# Patient Record
Sex: Male | Born: 1985 | Race: White | Hispanic: No | Marital: Single | Smoking: Former smoker
Health system: Southern US, Community
[De-identification: ages and names within clinical notes are randomized; demographics above are authoritative.]

## PROBLEM LIST (undated history)

## (undated) DIAGNOSIS — F419 Anxiety disorder, unspecified: Secondary | ICD-10-CM

## (undated) HISTORY — DX: Anxiety disorder, unspecified: F41.9

---

## 2012-10-06 ENCOUNTER — Encounter (HOSPITAL_COMMUNITY): Payer: Self-pay | Admitting: Psychiatry

## 2012-10-06 ENCOUNTER — Ambulatory Visit (INDEPENDENT_AMBULATORY_CARE_PROVIDER_SITE_OTHER): Payer: No Typology Code available for payment source | Admitting: Psychiatry

## 2012-10-06 VITALS — BP 117/65 | HR 84 | Ht 70.0 in | Wt 170.8 lb

## 2012-10-06 DIAGNOSIS — F419 Anxiety disorder, unspecified: Secondary | ICD-10-CM | POA: Insufficient documentation

## 2012-10-06 DIAGNOSIS — F411 Generalized anxiety disorder: Secondary | ICD-10-CM

## 2012-10-06 DIAGNOSIS — R51 Headache: Secondary | ICD-10-CM | POA: Insufficient documentation

## 2012-10-06 DIAGNOSIS — R519 Headache, unspecified: Secondary | ICD-10-CM | POA: Insufficient documentation
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
## 2012-11-17 ENCOUNTER — Ambulatory Visit (HOSPITAL_COMMUNITY): Payer: Self-pay | Admitting: Psychiatry

## 2013-03-02 ENCOUNTER — Ambulatory Visit (INDEPENDENT_AMBULATORY_CARE_PROVIDER_SITE_OTHER)
Admission: RE | Admit: 2013-03-02 | Discharge: 2013-03-02 | Disposition: A | Discharge status: Home / Self Care | Payer: No Typology Code available for payment source | Source: Ambulatory Visit | Attending: Family | Admitting: Family

## 2013-03-02 ENCOUNTER — Encounter: Payer: Self-pay | Admitting: Family

## 2013-03-02 ENCOUNTER — Ambulatory Visit (INDEPENDENT_AMBULATORY_CARE_PROVIDER_SITE_OTHER): Payer: No Typology Code available for payment source | Admitting: Family

## 2013-03-02 VITALS — BP 104/78 | Temp 97.8°F | Ht 70.0 in | Wt 161.0 lb

## 2013-03-02 DIAGNOSIS — M79672 Pain in left foot: Secondary | ICD-10-CM

## 2013-03-02 DIAGNOSIS — S8990XA Unspecified injury of unspecified lower leg, initial encounter: Secondary | ICD-10-CM

## 2013-03-02 DIAGNOSIS — S99922A Unspecified injury of left foot, initial encounter: Secondary | ICD-10-CM

## 2013-03-02 DIAGNOSIS — M79609 Pain in unspecified limb: Secondary | ICD-10-CM

## 2013-03-02 NOTE — Progress Notes (Signed)
  Subjective:    Patient ID: Jesse Holmes, male    DOB: [birth year], 27 y.o.   MRN: [id number]  HPI  27 year old male, nonsmoker, is in today to establish care. He has of left foot pain, he periodically had pain to the left foot. After playing tennis one and a half weeks ago, the pain flared again. Therefore, he decided to have it checked. The pain is typically a 5/10, described as achy and worse with certain positions. Has not taken any medication for relief.  Review of Systems  Constitutional: Negative.   HENT: Negative.   Respiratory: Negative.   Cardiovascular: Negative.   Gastrointestinal: Negative.   Endocrine: Negative.   Genitourinary: Negative.   Musculoskeletal: Negative.   Skin: Negative.   Neurological: Negative.   Psychiatric/Behavioral: Negative.    Past Medical History  Diagnosis Date  . Anxiety     History   Social History  . Marital Status: Single    Spouse Name: N/A    Number of Children: N/A  . Years of Education: N/A   Occupational History  . Not on file.   Social History Main Topics  . Smoking status: Former Games developer  . Smokeless tobacco: Not on file  . Alcohol Use: Yes  . Drug Use: No  . Sexual Activity: Not on file   Other Topics Concern  . Not on file   Social History Narrative  . No narrative on file    History reviewed. No pertinent past surgical history.  Family History  Problem Relation Age of Onset  . Depression Mother   . Drug abuse Brother     No Known Allergies  No current outpatient prescriptions on file prior to visit.   No current facility-administered medications on file prior to visit.    BP 104/78  Temp(Src) 97.8 F (36.6 C) (Oral)  Ht 5\' 10"  (1.778 m)  Wt 161 lb (73.029 kg)  BMI 23.1 kg/m2chart    Objective:   Physical Exam  Constitutional: He is oriented to person, place, and time. He appears well-developed and well-nourished.  HENT:  Right Ear: External ear normal.  Left Ear: External ear normal.   Nose: Nose normal.  Mouth/Throat: Oropharynx is clear and moist.  Neck: Normal range of motion. Neck supple.  Cardiovascular: Normal rate, regular rhythm and normal heart sounds.   Pulmonary/Chest: Effort normal and breath sounds normal.  Abdominal: Soft. Bowel sounds are normal.  Musculoskeletal: Normal range of motion. He exhibits tenderness.  Left foot pain to palpation of the dorsal aspect of the foot.  Neurological: He is alert and oriented to person, place, and time.  Skin: Skin is warm and dry.  Psychiatric: He has a normal mood and affect.          Assessment & Plan:  Assessment:  1. left foot pain  Plan: X-ray of the left foot Will notify patient pending results. Aleve as needed for pain. Call the office with any questions or concerns. Recheck as scheduled and as needed.

## 2013-10-26 ENCOUNTER — Other Ambulatory Visit (INDEPENDENT_AMBULATORY_CARE_PROVIDER_SITE_OTHER): Payer: BC Managed Care – PPO

## 2013-10-26 DIAGNOSIS — Z0000 Encounter for general adult medical examination without abnormal findings: Secondary | ICD-10-CM

## 2013-10-26 LAB — CBC WITH DIFFERENTIAL/PLATELET
BASOS ABS: 0 10*3/uL (ref 0.0–0.1)
BASOS PCT: 0.4 % (ref 0.0–3.0)
Eosinophils Absolute: 0.1 10*3/uL (ref 0.0–0.7)
Eosinophils Relative: 1.4 % (ref 0.0–5.0)
HCT: 45.4 % (ref 39.0–52.0)
HEMOGLOBIN: 15.4 g/dL (ref 13.0–17.0)
LYMPHS PCT: 26.4 % (ref 12.0–46.0)
Lymphs Abs: 1.9 10*3/uL (ref 0.7–4.0)
MCHC: 33.9 g/dL (ref 30.0–36.0)
MCV: 89.1 fl (ref 78.0–100.0)
MONOS PCT: 7.2 % (ref 3.0–12.0)
Monocytes Absolute: 0.5 10*3/uL (ref 0.1–1.0)
NEUTROS ABS: 4.6 10*3/uL (ref 1.4–7.7)
NEUTROS PCT: 64.6 % (ref 43.0–77.0)
Platelets: 319 10*3/uL (ref 150.0–400.0)
RBC: 5.09 Mil/uL (ref 4.22–5.81)
RDW: 13.3 % (ref 11.5–14.6)
WBC: 7.1 10*3/uL (ref 4.5–10.5)

## 2013-10-26 LAB — LIPID PANEL
CHOLESTEROL: 130 mg/dL (ref 0–200)
HDL: 58.2 mg/dL (ref >39.00)
LDL Cholesterol: 67 mg/dL (ref 0–99)
TRIGLYCERIDES: 23 mg/dL (ref 0.0–149.0)
Total CHOL/HDL Ratio: 2
VLDL: 4.6 mg/dL (ref 0.0–40.0)

## 2013-10-26 LAB — BASIC METABOLIC PANEL
BUN: 17 mg/dL (ref 6–23)
CALCIUM: 9.1 mg/dL (ref 8.4–10.5)
CO2: 26 mEq/L (ref 19–32)
Chloride: 108 mEq/L (ref 96–112)
Creatinine, Ser: 0.8 mg/dL (ref 0.4–1.5)
GFR: 122.66 mL/min (ref >60.00)
Glucose, Bld: 77 mg/dL (ref 70–99)
Potassium: 3.7 mEq/L (ref 3.5–5.1)
SODIUM: 142 meq/L (ref 135–145)

## 2013-10-26 LAB — HEPATIC FUNCTION PANEL
ALK PHOS: 56 U/L (ref 39–117)
ALT: 27 U/L (ref 0–53)
AST: 19 U/L (ref 0–37)
Albumin: 4.2 g/dL (ref 3.5–5.2)
BILIRUBIN DIRECT: 0.2 mg/dL (ref 0.0–0.3)
Total Bilirubin: 1.2 mg/dL (ref 0.3–1.2)
Total Protein: 6.9 g/dL (ref 6.0–8.3)

## 2013-10-26 LAB — POCT URINALYSIS DIPSTICK
Bilirubin, UA: NEGATIVE
Blood, UA: NEGATIVE
GLUCOSE UA: NEGATIVE
Leukocytes, UA: NEGATIVE
Nitrite, UA: NEGATIVE
PROTEIN UA: NEGATIVE
SPEC GRAV UA: 1.025
Urobilinogen, UA: 0.2
pH, UA: 6

## 2013-10-26 LAB — TSH: TSH: 1.56 u[IU]/mL (ref 0.35–5.50)

## 2013-10-26 NOTE — Addendum Note (Signed)
Addended by: Bonnye FavaKWEI, Onaje Warne K on: 10/26/2013 12:11 PM   Modules accepted: Orders

## 2013-11-01 ENCOUNTER — Ambulatory Visit (INDEPENDENT_AMBULATORY_CARE_PROVIDER_SITE_OTHER): Payer: BC Managed Care – PPO | Admitting: Family

## 2013-11-01 ENCOUNTER — Encounter: Payer: Self-pay | Admitting: Family

## 2013-11-01 VITALS — BP 102/64 | HR 76 | Temp 98.6°F | Ht 70.0 in | Wt 152.0 lb

## 2013-11-01 DIAGNOSIS — Z23 Encounter for immunization: Secondary | ICD-10-CM

## 2013-11-01 DIAGNOSIS — Z0000 Encounter for general adult medical examination without abnormal findings: Secondary | ICD-10-CM

## 2013-11-01 NOTE — Progress Notes (Signed)
Pre visit review using our clinic review tool, if applicable. No additional management support is needed unless otherwise documented below in the visit note. 

## 2013-11-01 NOTE — Progress Notes (Signed)
   Subjective:    Patient ID: Jesse [place] Holmes, male    DOB: 1985/09/05, 28 y.o.   MRN: [id number]  HPI 28 year old white male, nonsmoker than today for complete physical exam. Denies any concern today. Exercises routinely and follows a healthy diet.   Review of Systems  Constitutional: Negative.   HENT: Negative.   Eyes: Negative.   Respiratory: Negative.   Cardiovascular: Negative.   Gastrointestinal: Negative.   Endocrine: Negative.   Genitourinary: Negative.   Musculoskeletal: Negative.   Skin: Negative.   Allergic/Immunologic: Negative.   Neurological: Negative.   Hematological: Negative.   Psychiatric/Behavioral: Negative.    Past Medical History  Diagnosis Date  . Anxiety     History   Social History  . Marital Status: Single    Spouse Name: N/A    Number of Children: N/A  . Years of Education: N/A   Occupational History  . Not on file.   Social History Main Topics  . Smoking status: Former Games developermoker  . Smokeless tobacco: Not on file  . Alcohol Use: Yes  . Drug Use: No  . Sexual Activity: Not on file   Other Topics Concern  . Not on file   Social History Narrative  . No narrative on file    History reviewed. No pertinent past surgical history.  Family History  Problem Relation Age of Onset  . Depression Mother   . Drug abuse Brother     No Known Allergies  No current outpatient prescriptions on file prior to visit.   No current facility-administered medications on file prior to visit.    BP 102/64  Pulse 76  Temp(Src) 98.6 F (37 C) (Oral)  Ht 5\' 10"  (1.778 m)  Wt 152 lb (68.947 kg)  BMI 21.81 kg/m2  SpO2 99%chart     Objective:   Physical Exam  Constitutional: He is oriented to person, place, and time. He appears well-developed and well-nourished.  HENT:  Head: Normocephalic and atraumatic.  Right Ear: External ear normal.  Left Ear: External ear normal.  Nose: Nose normal.  Mouth/Throat: Oropharynx is clear and moist.  Eyes:  Conjunctivae are normal. Pupils are equal, round, and reactive to light.  Neck: Normal range of motion. Neck supple. No thyromegaly present.  Cardiovascular: Normal rate, regular rhythm and normal heart sounds.   Pulmonary/Chest: Effort normal and breath sounds normal.  Abdominal: Soft. Bowel sounds are normal.  Musculoskeletal: Normal range of motion.  Neurological: He is alert and oriented to person, place, and time. He has normal reflexes. Coordination normal.  Skin: Skin is warm and dry.  Psychiatric: He has a normal mood and affect.          Assessment & Plan:  Jesse Holmes was seen today for annual exam.  Diagnoses and associated orders for this visit:  Preventative health care  Need for prophylactic vaccination with combined diphtheria-tetanus-pertussis (DTP) vaccine - Tdap vaccine greater than or equal to 7yo IM    call the office with any questions or concerns. Check her schedule and as needed.

## 2013-11-01 NOTE — Patient Instructions (Signed)
Exercise to Stay Healthy Exercise helps you become and stay healthy. EXERCISE IDEAS AND TIPS Choose exercises that:  You enjoy.  Fit into your day. You do not need to exercise really hard to be healthy. You can do exercises at a slow or medium level and stay healthy. You can:  Stretch before and after working out.  Try yoga, Pilates, or tai chi.  Lift weights.  Walk fast, swim, jog, run, climb stairs, bicycle, dance, or rollerskate.  Take aerobic classes. Exercises that burn about 150 calories:  Running 1  miles in 15 minutes.  Playing volleyball for 45 to 60 minutes.  Washing and waxing a car for 45 to 60 minutes.  Playing touch football for 45 minutes.  Walking 1  miles in 35 minutes.  Pushing a stroller 1  miles in 30 minutes.  Playing basketball for 30 minutes.  Raking leaves for 30 minutes.  Bicycling 5 miles in 30 minutes.  Walking 2 miles in 30 minutes.  Dancing for 30 minutes.  Shoveling snow for 15 minutes.  Swimming laps for 20 minutes.  Walking up stairs for 15 minutes.  Bicycling 4 miles in 15 minutes.  Gardening for 30 to 45 minutes.  Jumping rope for 15 minutes.  Washing windows or floors for 45 to 60 minutes. Document Released: 07/27/2010 Document Revised: 09/16/2011 Document Reviewed: 07/27/2010 Exit[employer] Patient Information 2014 [employer].  

## 2013-11-08 ENCOUNTER — Encounter: Payer: Self-pay | Admitting: Family

## 2013-11-08 ENCOUNTER — Ambulatory Visit (INDEPENDENT_AMBULATORY_CARE_PROVIDER_SITE_OTHER)
Admission: RE | Admit: 2013-11-08 | Discharge: 2013-11-08 | Disposition: A | Discharge status: Home / Self Care | Payer: BC Managed Care – PPO | Source: Ambulatory Visit | Attending: Family | Admitting: Family

## 2013-11-08 ENCOUNTER — Ambulatory Visit (INDEPENDENT_AMBULATORY_CARE_PROVIDER_SITE_OTHER): Payer: BC Managed Care – PPO | Admitting: Family

## 2013-11-08 VITALS — BP 120/72 | HR 112 | Wt 150.0 lb

## 2013-11-08 DIAGNOSIS — S6992XA Unspecified injury of left wrist, hand and finger(s), initial encounter: Secondary | ICD-10-CM

## 2013-11-08 DIAGNOSIS — S59919A Unspecified injury of unspecified forearm, initial encounter: Secondary | ICD-10-CM

## 2013-11-08 DIAGNOSIS — S6990XA Unspecified injury of unspecified wrist, hand and finger(s), initial encounter: Secondary | ICD-10-CM

## 2013-11-08 DIAGNOSIS — M25539 Pain in unspecified wrist: Secondary | ICD-10-CM

## 2013-11-08 DIAGNOSIS — S59909A Unspecified injury of unspecified elbow, initial encounter: Secondary | ICD-10-CM

## 2013-11-08 MED ORDER — DICLOFENAC SODIUM 75 MG PO TBEC: 75.0000 mg | DELAYED_RELEASE_TABLET | Freq: Two times a day (BID) | ORAL | Status: DC

## 2013-11-08 NOTE — Progress Notes (Signed)
   Subjective:    Patient ID: Jesse Holmes, male    DOB: [birth year], 28 y.o.   MRN: [id number]  HPI 28 year old white male, nonsmoker, is in today with complaints of left wrist pain that began on 10/31/2013. He reports an injury at work but is not quite sure how he did it. 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 Rates the pain 3/10, worse with movement. Has taken Aleve and then using a splint helps. Reports heavy lifting at work 11/06/13 symptoms worsen.    Review of Systems  Constitutional: Negative.   Respiratory: Negative.   Cardiovascular: Negative.   Gastrointestinal: Negative.   Endocrine: Negative.   Genitourinary: Negative.   Musculoskeletal: Positive for arthralgias.       Left wrist pain  Skin: Negative.   Allergic/Immunologic: Negative.   Neurological: Negative.   Hematological: Negative.   Psychiatric/Behavioral: Negative.    Past Medical History  Diagnosis Date  . Anxiety     History   Social History  . Marital Status: Single    Spouse Name: N/A    Number of Children: N/A  . Years of Education: N/A   Occupational History  . Not on file.   Social History Main Topics  . Smoking status: Former Games developermoker  . Smokeless tobacco: Not on file  . Alcohol Use: Yes  . Drug Use: No  . Sexual Activity: Not on file   Other Topics Concern  . Not on file   Social History Narrative  . No narrative on file    No past surgical history on file.  Family History  Problem Relation Age of Onset  . Depression Mother   . Drug abuse Brother     No Known Allergies  No current outpatient prescriptions on file prior to visit.   No current facility-administered medications on file prior to visit.    BP 120/72  Pulse 112  Wt 150 lb (68.04 kg)  SpO2 99%chart    Objective:   Physical Exam  Constitutional: He is oriented to person, place, and time. He appears well-developed and well-nourished.  Neck: Normal range of motion. Neck supple.  Cardiovascular: Normal rate,  regular rhythm and normal heart sounds.   Pulmonary/Chest: Effort normal and breath sounds normal.  Musculoskeletal: He exhibits tenderness. He exhibits no edema.  Left wrist: Pain with flexion and extension. No obvious swelling. Tenderness to palpation medially.  Neurological: He is alert and oriented to person, place, and time.  Skin: Skin is warm and dry.  Psychiatric: He has a normal mood and affect.          Assessment & Plan:  Jesse Holmes was seen today for wrist injury.  Diagnoses and associated orders for this visit:  Wrist pain, acute - DG Wrist Complete Left; Future  Left wrist injury - DG Wrist Complete Left; Future  Other Orders - diclofenac (VOLTAREN) 75 MG EC tablet; Take 1 tablet (75 mg total) by mouth 2 (two) times daily.    call the office with any questions or concerns. Note given for work. Recheck if symptoms 2 weeks.

## 2013-11-08 NOTE — Patient Instructions (Signed)
Wrist Exercises RANGE OF MOTION (ROM) AND STRETCHING EXERCISES - Wrist Sprain  These exercises may help you when beginning to rehabilitate your injury. Your symptoms may resolve with or without further involvement from your physician, physical therapist or athletic trainer. While completing these exercises, remember:   Restoring tissue flexibility helps normal motion to return to the joints. This allows healthier, less painful movement and activity.  An effective stretch should be held for at least 30 seconds.  A stretch should never be painful. You should only feel a gentle lengthening or release in the stretched tissue. RANGE OF MOTION  Wrist Flexion, Active-Assisted  Extend your right / left elbow with your palm pointing down.*  Gently pull the back of your hand towards you until you feel a gentle stretch on the top of your forearm.  Hold this position for __________ seconds. Repeat __________ times. Complete this exercise __________ times per day.  *If directed by your physician, physical therapist or athletic trainer, complete this stretch with your elbow bent rather than extended. RANGE OF MOTION  Wrist Extension, Active-Assisted   Extend your right / left elbow and turn your palm upwards.*  Gently pull your palm/fingertips back so your wrist extends and your fingers point more toward the ground.  You should feel a gentle stretch on the inside of your forearm.  Hold this position for __________ seconds. Repeat __________ times. Complete this exercise __________ times per day. *If directed by your physician, physical therapist or athletic trainer, complete this stretch with your elbow bent, rather than extended. RANGE OF MOTION  Supination, Active   Stand or sit with your elbows at your side. Bend your right / left elbow to 90 degrees.  Turn your palm upward until you feel a gentle stretch on the inside of your forearm.  Hold this position for __________ seconds. Slowly  release and return to the starting position. Repeat __________ times. Complete this stretch __________ times per day.  RANGE OF MOTION  Pronation, Active   Stand or sit with your elbows at your side. Bend your right / left elbow to 90 degrees.  Turn your palm downward until you feel a gentle stretch on the top of your forearm.  Hold this position for __________ seconds. Slowly release and return to the starting position. Repeat __________ times. Complete this stretch __________ times per day.  STRENGTHENING EXERCISES  These exercises may help you when beginning to rehabilitate your injury. They may resolve your symptoms with or without further involvement from your physician, physical therapist or athletic trainer. While completing these exercises, remember:   Muscles can gain both the endurance and the strength needed for everyday activities through controlled exercises.  Complete these exercises as instructed by your physician, physical therapist or athletic trainer. Progress the resistance and repetitions only as guided.  You may experience muscle soreness or fatigue, but the pain or discomfort you are trying to eliminate should never worsen during these exercises. If this pain does worsen, stop and make certain you are following the directions exactly. If the pain is still present after adjustments, discontinue the exercise until you can discuss the trouble with your clinician. STRENGTH Wrist Flexors  Sit with your right / left forearm palm-up and fully supported. Your elbow should be resting below the height of your shoulder. Allow your wrist to extend over the edge of the surface.  Loosely holding a __________ weight or a piece of rubber exercise band/tubing, slowly curl your hand up toward your forearm.  Hold this position for __________ seconds. Slowly lower the wrist back to the starting position in a controlled manner. Repeat __________ times. Complete this exercise __________  times per day.  STRENGTH  Wrist Extensors  Sit with your right / left forearm palm-down and fully supported. Your elbow should be resting below the height of your shoulder. Allow your wrist to extend over the edge of the surface.  Loosely holding a __________ weight or a piece of rubber exercise band/tubing, slowly curl your handup toward your forearm.  Hold this position for __________ seconds. Slowly lower the wrist back to the starting position in a controlled manner. Repeat __________ times. Complete this exercise __________ times per day.  STRENGTH  Forearm Supinators  Sit with your right / left forearm supported on a table, keeping your elbow below shoulder height. Rest your hand over the edge, palm down.  Gently grip a hammer or a soup ladle.  Without moving your elbow, slowly turn your palm and hand upward to a "thumbs-up" position.  Hold this position for __________ seconds. Slowly return to the starting position. Repeat __________ times. Complete this exercise __________ times per day.  STRENGTH  Forearm Pronators   Sit with your right / left forearm supported on a table, keeping your elbow below shoulder height. Rest your hand over the edge, palm up.  Gently grip a hammer or a soup ladle.  Without moving your elbow, slowly turn your palm and hand upward to a "thumbs-up" position.  Hold this position for __________ seconds. Slowly return to the starting position. Repeat __________ times. Complete this exercise __________ times per day.  STRENGTH - Grip  Grasp a tennis ball, a dense sponge, or a large, rolled sock in your hand.  Squeeze as hard as you can without increasing any pain.  Hold this position for __________ seconds. Release your grip slowly. Repeat __________ times. Complete this exercise __________ times per day.  Document Released: 05/08/2005 Document Revised: 09/16/2011 Document Reviewed: 10/06/2008 [facility] Patient Information 2014 [place], [place].

## 2013-11-26 ENCOUNTER — Ambulatory Visit (INDEPENDENT_AMBULATORY_CARE_PROVIDER_SITE_OTHER): Payer: BC Managed Care – PPO | Admitting: Family Medicine

## 2013-11-26 ENCOUNTER — Encounter: Payer: Self-pay | Admitting: Family Medicine

## 2013-11-26 VITALS — BP 110/78 | HR 70 | Wt 150.0 lb

## 2013-11-26 DIAGNOSIS — M25532 Pain in left wrist: Secondary | ICD-10-CM

## 2013-11-26 DIAGNOSIS — M25539 Pain in unspecified wrist: Secondary | ICD-10-CM

## 2013-11-26 NOTE — Patient Instructions (Signed)
Try some icing to wrist (15-20 minutes 2-3 times daily) Continue with wrist brace Start back Diclofenac 75 mg twice daily with food Touch base by phone 2 weeks if no better.

## 2013-11-26 NOTE — Progress Notes (Signed)
   Subjective:    Patient ID: Jesse Holmes, male    DOB: [birth year], 28 y.o.   MRN: [id number]  Wrist Pain  Pertinent negatives include no numbness.   Patient seen with left wrist pain. Refer to prior note. He first noted pain on 10/31/2013 with possible injury at work but he does not recall specific mechanism. His pain was somewhat poorly localized. He then was doing some lifting on 5-2- 2015 again at work and noticed worsening pain. He had x-rays which did not show any acute bony abnormality. He was prescribed diclofenac but only took one dose. Has not tried any icing.  Has tried bracing off and on. Pain is more along the dorsal aspect of the wrist near the joint space.  Has not seen ecchymosis or swelling. No erythema or warmth. Denies any history of fall.  Past Medical History  Diagnosis Date  . Anxiety    No past surgical history on file.  reports that he has quit smoking. He does not have any smokeless tobacco history on file. He reports that he drinks alcohol. He reports that he does not use illicit drugs. family history includes Depression in his mother; Drug abuse in his brother. No Known Allergies    Review of Systems  Neurological: Negative for weakness and numbness.       Objective:   Physical Exam  Constitutional: He appears well-developed and well-nourished. No distress.  Musculoskeletal:  Left wrist reveals no erythema. No edema. No ecchymosis. He does not have any distal radial or ulnar tenderness. No snuff box tenderness. Full range of motion with extension and flexion. No collateral ligament tenderness. No abduction or or extensor thumb tendon tenderness to palpation. He does have some nonspecific tenderness dorsally over the joint space.  Neurological:  Full grip strength. No muscle atrophy. Normal sensory function.          Assessment & Plan:  Left wrist pain. Question strain. No evidence for de Quervain's tenosynovitis. Recent x-rays unremarkable. We've  recommended continued splinting. Try icing intermittently. Start diclofenac 75 mg twice daily. No heavy lifting over 10 pounds over the next couple weeks. Touch base by phone 2 weeks if no better. Consider orthopedic referral for better that time

## 2013-11-26 NOTE — Progress Notes (Signed)
Pre visit review using our clinic review tool, if applicable. No additional management support is needed unless otherwise documented below in the visit note. 

## 2014-03-07 ENCOUNTER — Ambulatory Visit: Payer: Self-pay | Admitting: Family

## 2014-12-15 ENCOUNTER — Encounter: Payer: Self-pay | Admitting: Adult Health

## 2014-12-15 ENCOUNTER — Ambulatory Visit (INDEPENDENT_AMBULATORY_CARE_PROVIDER_SITE_OTHER): Payer: 59 | Admitting: Adult Health

## 2014-12-15 VITALS — BP 98/68 | Temp 98.5°F | Ht 70.0 in | Wt 187.0 lb

## 2014-12-15 DIAGNOSIS — L210 Seborrhea capitis: Secondary | ICD-10-CM

## 2014-12-15 DIAGNOSIS — L219 Seborrheic dermatitis, unspecified: Secondary | ICD-10-CM | POA: Diagnosis not present

## 2014-12-15 MED ORDER — KETOCONAZOLE 2 % EX SHAM: 1.0000 "application " | MEDICATED_SHAMPOO | CUTANEOUS | Status: AC

## 2014-12-15 NOTE — Progress Notes (Signed)
   Subjective:    Patient ID: Jesse Holmes, male    DOB: 1985/07/29, 29 y.o.   MRN: [id number]  HPI  Patient presents to the office today for refill on his Ketoconazole 2% shampoo. He has no other problems at this time.     Review of Systems  HENT:       Dandruff  All other systems reviewed and are negative.      Objective:   Physical Exam  Constitutional: He is oriented to person, place, and time. He appears well-developed and well-nourished. No distress.  HENT:  Slight amount of dandruff  Neurological: He is alert and oriented to person, place, and time.  Skin: Skin is warm and dry. No rash noted. No erythema. No pallor.  Psychiatric: He has a normal mood and affect. His behavior is normal. Judgment and thought content normal.  Nursing note and vitals reviewed.      Assessment & Plan:  1. Dandruff - ketoconazole (NIZORAL) 2 % shampoo; Apply 1 application topically 2 (two) times a week.  Dispense: 120 mL; Refill: 10

## 2014-12-15 NOTE — Progress Notes (Signed)
Pre visit review using our clinic review tool, if applicable. No additional management support is needed unless otherwise documented below in the visit note. 

## 2014-12-15 NOTE — Patient Instructions (Signed)
Your prescription has been sent in to the pharmacy. If you need anything else, please let me know.

## 2015-05-02 ENCOUNTER — Encounter: Payer: Self-pay | Admitting: Family Medicine

## 2015-05-02 ENCOUNTER — Ambulatory Visit (INDEPENDENT_AMBULATORY_CARE_PROVIDER_SITE_OTHER): Payer: 59 | Admitting: Family Medicine

## 2015-05-02 VITALS — BP 117/77 | HR 98 | Temp 97.9°F | Ht 70.0 in | Wt 173.0 lb

## 2015-05-02 DIAGNOSIS — J019 Acute sinusitis, unspecified: Secondary | ICD-10-CM | POA: Diagnosis not present

## 2015-05-02 MED ORDER — AZITHROMYCIN 250 MG PO TABS: ORAL_TABLET | ORAL | Status: DC

## 2015-05-02 MED ORDER — HYDROCODONE-HOMATROPINE 5-1.5 MG/5ML PO SYRP: 5.0000 mL | ORAL_SOLUTION | ORAL | Status: DC | PRN

## 2015-05-02 NOTE — Progress Notes (Signed)
Pre visit review using our clinic review tool, if applicable. No additional management support is needed unless otherwise documented below in the visit note. 

## 2015-05-02 NOTE — Progress Notes (Signed)
   Subjective:    Patient ID: Jesse Holmes, male    DOB: 12-28-1985, 29 y.o.   MRN: [id number]  HPI Here for one week of sinus pressure, PND, and a dry cough. No fever. On Mucinex.    Review of Systems  Constitutional: Negative.   HENT: Positive for congestion, postnasal drip and sinus pressure. Negative for sore throat.   Eyes: Negative.   Respiratory: Positive for cough.        Objective:   Physical Exam  Constitutional: He appears well-developed and well-nourished.  HENT:  Right Ear: External ear normal.  Left Ear: External ear normal.  Nose: Nose normal.  Mouth/Throat: Oropharynx is clear and moist.  Eyes: Conjunctivae are normal.  Neck: No thyromegaly present.  Cardiovascular: Normal rate, regular rhythm, normal heart sounds and intact distal pulses.   Pulmonary/Chest: Effort normal and breath sounds normal.  Lymphadenopathy:    He has no cervical adenopathy.          Assessment & Plan:  Sinusitis, treat with a Zpack. Written out of work yesterday and today.

## 2015-05-08 ENCOUNTER — Ambulatory Visit (HOSPITAL_COMMUNITY): Payer: Self-pay | Admitting: Psychiatry

## 2015-05-17 ENCOUNTER — Encounter: Payer: Self-pay | Admitting: Family Medicine

## 2015-05-17 ENCOUNTER — Ambulatory Visit (INDEPENDENT_AMBULATORY_CARE_PROVIDER_SITE_OTHER): Payer: 59 | Admitting: Family Medicine

## 2015-05-17 VITALS — BP 115/73 | HR 85 | Temp 98.3°F | Ht 70.0 in | Wt 174.0 lb

## 2015-05-17 DIAGNOSIS — J209 Acute bronchitis, unspecified: Secondary | ICD-10-CM | POA: Diagnosis not present

## 2015-05-17 MED ORDER — LEVOFLOXACIN 500 MG PO TABS: 500.0000 mg | ORAL_TABLET | Freq: Every day | ORAL | Status: AC

## 2015-05-17 MED ORDER — BENZONATATE 200 MG PO CAPS: 200.0000 mg | ORAL_CAPSULE | Freq: Two times a day (BID) | ORAL | Status: AC | PRN

## 2015-05-17 NOTE — Progress Notes (Signed)
   Subjective:    Patient ID: Jesse Holmes, male    DOB: February 29, 1986, 29 y.o.   MRN: [id number]  HPI Here for a stubborn dry cough that started about 2 weeks ago. His chest is tight but there is no chest pain. He had some sinus pressure at the beginning but this has resolved. He was here on 05-02-15 and he was given a Zpack. This seemed to help for a few days but then he got worse again. He was given a rx for Hydromet syrup last time but he never filled it because his bother has an opiate addiction and this made him nervous.    Review of Systems  Constitutional: Negative.   HENT: Positive for congestion. Negative for postnasal drip, sinus pressure and sore throat.   Eyes: Negative.   Respiratory: Positive for cough and chest tightness. Negative for shortness of breath and wheezing.   Cardiovascular: Negative.        Objective:   Physical Exam  Constitutional: He appears well-developed and well-nourished.  HENT:  Right Ear: External ear normal.  Left Ear: External ear normal.  Nose: Nose normal.  Mouth/Throat: Oropharynx is clear and moist.  Eyes: Conjunctivae are normal.  Neck: No thyromegaly present.  Pulmonary/Chest: Effort normal. No respiratory distress. He has no wheezes. He has no rales.  Scattered rhonchi   Lymphadenopathy:    He has no cervical adenopathy.          Assessment & Plan:  Bronchitis, treat with Levaquin. Try Benzonatate for the cough.

## 2015-05-17 NOTE — Progress Notes (Signed)
Pre visit review using our clinic review tool, if applicable. No additional management support is needed unless otherwise documented below in the visit note. 

## 2015-05-30 ENCOUNTER — Telehealth: Payer: Self-pay | Admitting: Family Medicine

## 2015-05-30 NOTE — Telephone Encounter (Signed)
These GI symptoms are almost certainly side effects of the Levaquin, so stop the Levaquin now. Use Imodium prn. If he feels that the chest infection has improved he can return to work when he is ready

## 2015-05-30 NOTE — Telephone Encounter (Signed)
See my other phone note response

## 2015-05-30 NOTE — Telephone Encounter (Signed)
I spoke with pt and went over the below information. 

## 2015-05-30 NOTE — Telephone Encounter (Signed)
Mr. Elige RadonBradley called saying Dr. Clent RidgesFry prescribed a Zpack for his cough which has not improved. He mentioned being prescribed Levaquin as well and he's been taking it for 6-7 days. After taking the Levaquin he's been experiencing nausea, vomiting, and diarrhea. He's wondering if he needs to stop taking the medication and if it would be ok for him to return to work. Please call the pt.  Pt ph# [phone] Thank you.

## 2015-05-30 NOTE — Telephone Encounter (Signed)
Jesse ElmSylvia, please discuss with Dr. Clent RidgesFry and call pt.

## 2015-05-30 NOTE — Telephone Encounter (Signed)
Patient Name: Jesse Holmes  DOB: [birth year]    Initial Comment caller states he has vomiting, nausea, diarrhea, insomnia, abd bloating   Nurse Assessment  Nurse: Stefano GaulStringer, RN, Dwana CurdVera Date/Time (Eastern Time): 05/30/2015 1:25:16 PM  Confirm and document reason for call. If symptomatic, describe symptoms. ---Caller states he recently started Levaquin for bronchitis. Has had 4-5 diarrhea stools daily for 2 days. He has been nauseated. He just vomited his lunch. Only vomited x 1 today. No fever. Abd feels bloated. No pain or dizziness.  Has the patient traveled out of the country within the last 30 days? ---No  Does the patient have any new or worsening symptoms? ---Yes  Will a triage be completed? ---Yes  Related visit to physician within the last 2 weeks? ---No  Does the PT have any chronic conditions? (i.e. diabetes, asthma, etc.) ---No  Is this a behavioral health call? ---No     Guidelines    Guideline Title Affirmed Question Affirmed Notes  Diarrhea MILD-MODERATE diarrhea (e.g., 1-6 times / day more than normal) (all triage questions negative)   Vomiting MILD or MODERATE vomiting (e.g., 1 - 5 times / day) (all triage questions negative)    Final Disposition User   [facility], RN, Vera    Comments  Caller states he has had the diarrhea since beginning Levaquin and has been nauseated. Just vomited x 1 today. Wants to know if he should continue the antibiotic or if needs to be changed. Please call pt and let him know.  Has had 4-5 diarrhea stools daily for 2 days   Disagree/Comply: Comply    Disagree/Comply: Comply

## 2015-05-31 ENCOUNTER — Telehealth: Payer: Self-pay | Admitting: Family

## 2015-05-31 NOTE — Telephone Encounter (Signed)
Per pt he does not need the note .

## 2015-05-31 NOTE — Telephone Encounter (Signed)
Per Dr. Clent RidgesFry okay to give note.

## 2015-05-31 NOTE — Telephone Encounter (Signed)
Pt said he called yesterday because the antibiotic he was taking made him nause. Pt said he was told to stay out of work until his cough cleared up but can not stay out unless he has a doctors note. Pt is at work today and will be off the next 2 days. He said he can take the rest of the day off if a doctors note is written and he will come and pick up the note.

## 2015-12-30 IMAGING — CR DG WRIST COMPLETE 3+V*L*
2 series · 2 of 2 positions shown · non-contrast
Comparison: None.

CLINICAL DATA: Injury to left wrist last 2 weeks

EXAM:
LEFT WRIST - COMPLETE 3+ VIEW

[view not recorded (1 of 2)]
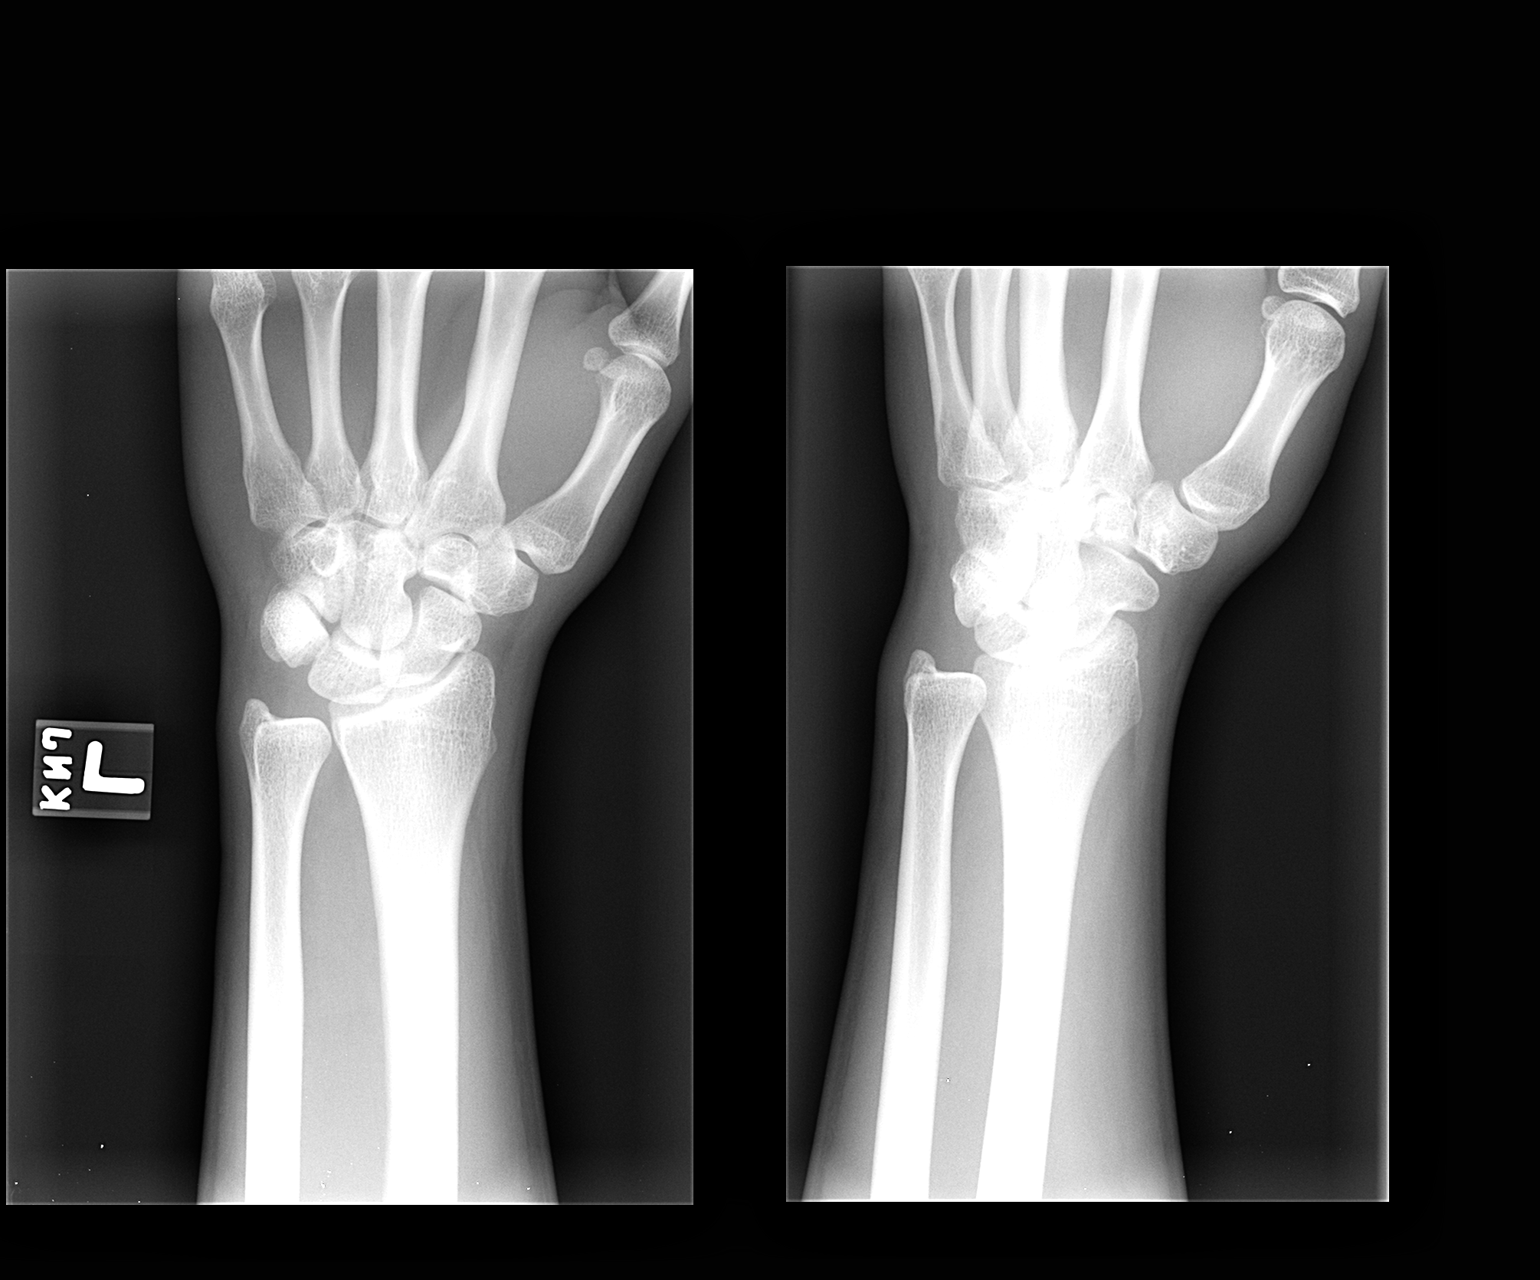

[view not recorded (2 of 2)]
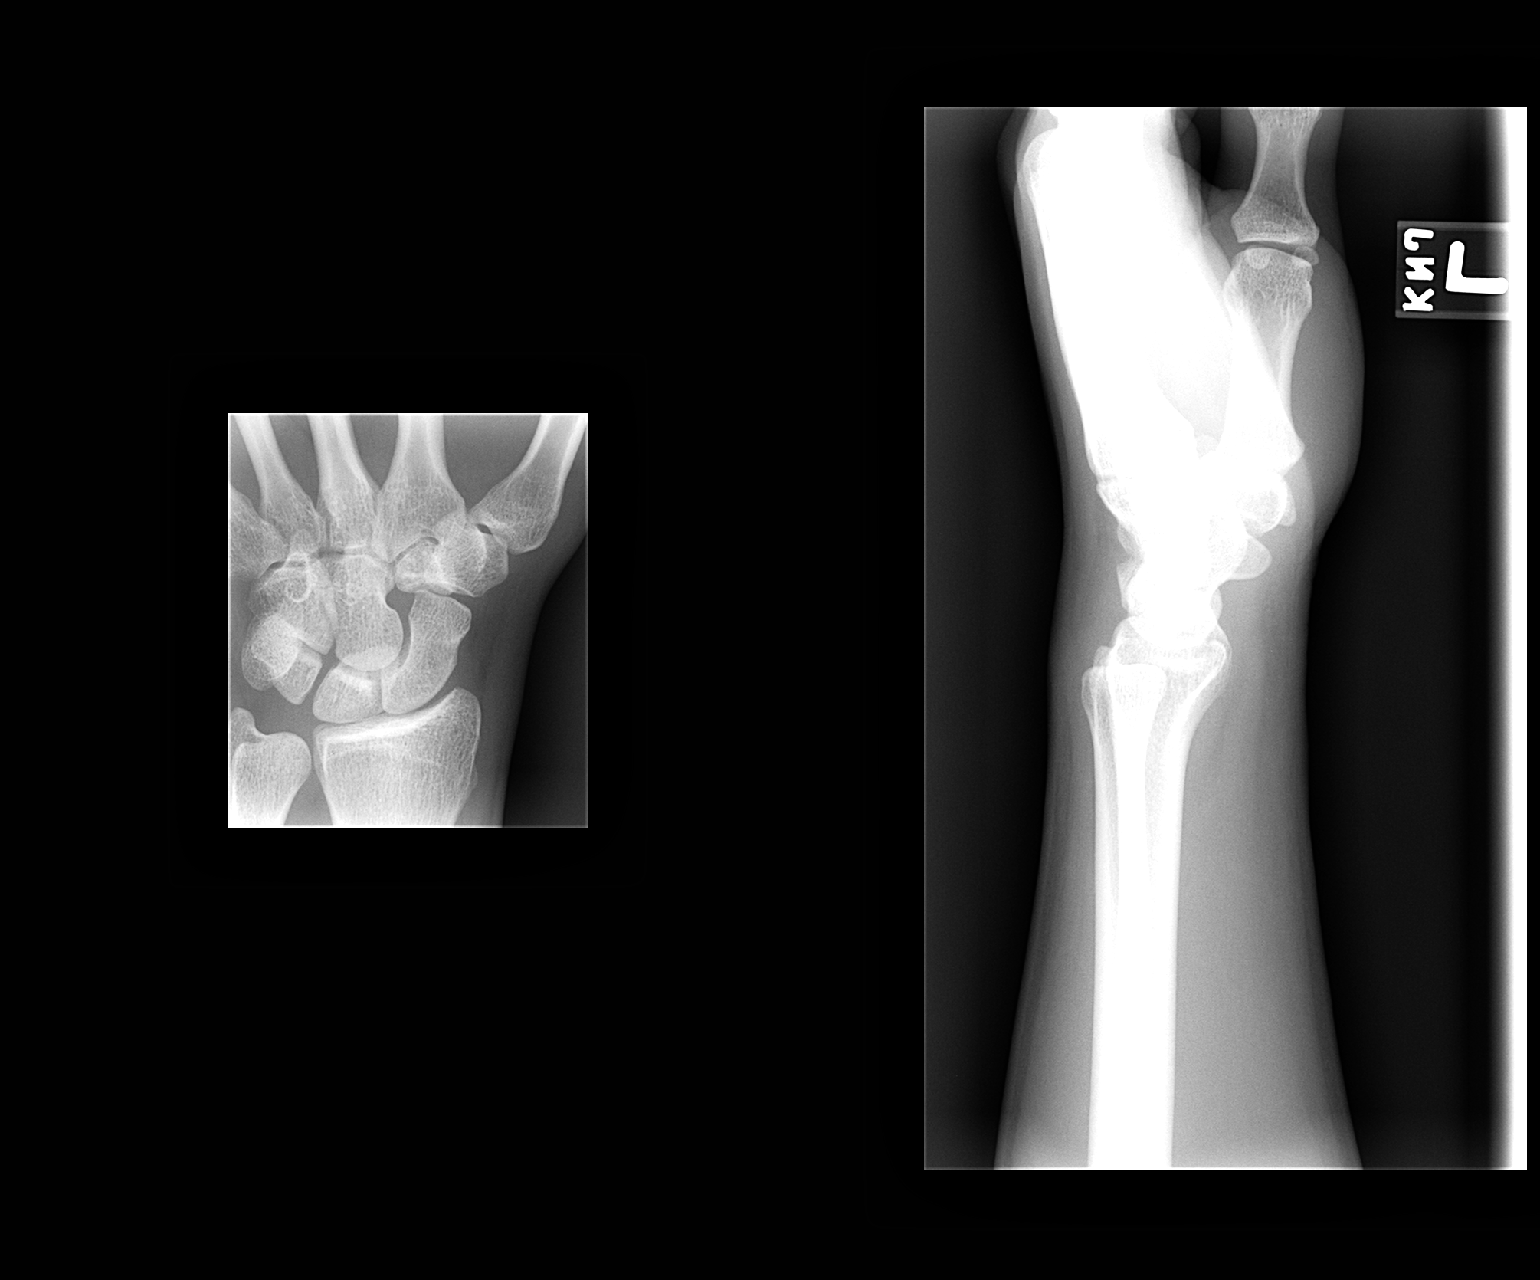

[2 of 2 positions shown; findings below may reference images not displayed]

FINDINGS: Four views of left wrist submitted. No acute fracture or
subluxation. No radiopaque foreign body.
IMPRESSION: Negative.
